# Patient Record
Sex: Male | Born: 1995 | Race: Black or African American | Hispanic: No | Marital: Single | Smoking: Never smoker
Health system: Southern US, Community
[De-identification: ages and names within clinical notes are randomized; demographics above are authoritative.]

## PROBLEM LIST (undated history)

## (undated) DIAGNOSIS — F840 Autistic disorder: Secondary | ICD-10-CM

---
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
## 2017-09-17 ENCOUNTER — Emergency Department (HOSPITAL_BASED_OUTPATIENT_CLINIC_OR_DEPARTMENT_OTHER)
Admission: EM | Admit: 2017-09-17 | Discharge: 2017-09-17 | Disposition: A | Discharge status: Home / Self Care | Payer: BLUE CROSS/BLUE SHIELD | Attending: Emergency Medicine | Admitting: Emergency Medicine

## 2017-09-17 ENCOUNTER — Encounter (HOSPITAL_BASED_OUTPATIENT_CLINIC_OR_DEPARTMENT_OTHER): Payer: Self-pay | Admitting: Emergency Medicine

## 2017-09-17 ENCOUNTER — Other Ambulatory Visit: Payer: Self-pay

## 2017-09-17 ENCOUNTER — Emergency Department (HOSPITAL_BASED_OUTPATIENT_CLINIC_OR_DEPARTMENT_OTHER): Payer: BLUE CROSS/BLUE SHIELD

## 2017-09-17 DIAGNOSIS — F840 Autistic disorder: Secondary | ICD-10-CM | POA: Insufficient documentation

## 2017-09-17 DIAGNOSIS — W010XXA Fall on same level from slipping, tripping and stumbling without subsequent striking against object, initial encounter: Secondary | ICD-10-CM | POA: Insufficient documentation

## 2017-09-17 DIAGNOSIS — Y92838 Other recreation area as the place of occurrence of the external cause: Secondary | ICD-10-CM | POA: Insufficient documentation

## 2017-09-17 DIAGNOSIS — Y998 Other external cause status: Secondary | ICD-10-CM | POA: Insufficient documentation

## 2017-09-17 DIAGNOSIS — S92251A Displaced fracture of navicular [scaphoid] of right foot, initial encounter for closed fracture: Secondary | ICD-10-CM | POA: Diagnosis not present

## 2017-09-17 DIAGNOSIS — Y9344 Activity, trampolining: Secondary | ICD-10-CM | POA: Diagnosis not present

## 2017-09-17 DIAGNOSIS — S99921A Unspecified injury of right foot, initial encounter: Secondary | ICD-10-CM | POA: Diagnosis present

## 2017-09-17 HISTORY — DX: Autistic disorder: F84.0

## 2017-09-17 MED ORDER — OXYCODONE-ACETAMINOPHEN 5-325 MG PO TABS: 1.0000 | ORAL_TABLET | Freq: Four times a day (QID) | ORAL | 0 refills | Status: AC | PRN

## 2017-09-17 NOTE — ED Triage Notes (Signed)
R foot and ankle pain after twisting it at the trampoline park.

## 2017-09-17 NOTE — Discharge Instructions (Addendum)
Please read and follow all provided instructions.  You have been seen today for an injury to your right foot/ankle.  Tests performed today include: An x-ray of the affected area showed that you have a fracture in the foot, this is a fracture of the navicular bone.  Placing your right lower extremity in a splint, please remain in the splint and remain nonweightbearing utilizing crutches until you have followed up with the orthopedic doctor and your discharge instructions. Vital signs. See below for your results today.   Home care instructions: -- *PRICE in the first 24-48 hours after injury: Protect (with brace, splint, sling), if given by your provider Rest Ice-apply ice pack or ice wrapped in a towel/plastic bag over the foot in the splint.  Apply ice for 20 min, then remove for 40 min while awake Compression- Wear brace, elastic bandage, splint as directed by your provider-please be sure to wear this and do not remove until you have seen the orthopedic doctor. Elevate affected extremity above the level of your heart when not walking around for the first 24-48 hours   Medications:  Take ibuprofen 600 mg every 6 hours as needed for pain.  You were given a prescription for Percocet which you may take for severe pain.  Percocet-this is a narcotic/controlled substance medication that has potential addicting qualities.  We recommend that you take 1-2 tablets every 6 hours as needed for severe pain.  Do not drive or operate heavy machinery when taking this medicine as it can be sedating. Do not drink alcohol or take other sedating medications when taking this medicine for safety reasons.  Keep this out of reach of small children.  Please be aware this medicine has Tylenol in it (325 mg/tab) do not exceed the maximum dose of Tylenol in a day per over the counter recommendations should you decide to supplement with Tylenol over the counter.   We have prescribed you new medication(s) today. Discuss the  medications prescribed today with your pharmacist as they can have adverse effects and interactions with your other medicines including over the counter and prescribed medications. Seek medical evaluation if you start to experience new or abnormal symptoms after taking one of these medicines, seek care immediately if you start to experience difficulty breathing, feeling of your throat closing, facial swelling, or rash as these could be indications of a more serious allergic reaction  Follow-up instructions: Please follow-up with the orthopedic doctor and your discharge instructions within 5 days.  Return instructions:  Please return if your digits or extremity are numb or tingling, appear gray or blue, or you have severe pain (also elevate the extremity and loosen splint or wrap if you were given one) Please return if you have redness or fevers.  Please return to the Emergency Department if you experience worsening symptoms.  Please return if you have any other emergent concerns. Additional Information:  Your vital signs today were: BP 125/74 (BP Location: Left Arm)    Pulse 79    Temp 98.4 F (36.9 C) (Oral)    Resp 16    SpO2 100%  If your blood pressure (BP) was elevated above 135/85 this visit, please have this repeated by your doctor within one month. ---------------

## 2017-09-17 NOTE — ED Provider Notes (Signed)
[facility] EMERGENCY DEPARTMENT Provider Note   CSN: [id number] Arrival date & time: 09/17/17  1446   History   Chief Complaint Chief Complaint  Patient presents with  . Foot Injury    HPI Phillip Carroll is a 22 y.o. male with a hx of autism who presents to the ED with his mother with complaints of R foot/ankle injury which occurred yesterday. Patient states that he was at a trampoline park and when he came down after jumping he landed with an inversion injury to the R ankle/foot. He did not fall to the ground or hit his head. He has been having fairly persistent pain/swelling mostly to the mid/forefoot. Pain is a 4/10 in severity, worse with attempting to bear weight, has been favoring the LLE.  Denies numbness tingling, weakness, or any other areas of injury.  HPI  Past Medical History:  Diagnosis Date  . Autism     Patient Active Problem List   Diagnosis Date Noted  . Essential and other specified forms of tremor 04/18/2012  . Headache(784.0) 04/18/2012  . Other nonspecific abnormal result of function study of brain and central nervous system 04/18/2012  . Nystagmus, unspecified 04/18/2012    History reviewed. No pertinent surgical history.      Home Medications    Prior to Admission medications   Not on File    Family History Family History  Problem Relation Age of Onset  . Migraines Mother   . Seizures Cousin   . Down syndrome Cousin     Social History Social History   Tobacco Use  . Smoking status: Never Smoker  . Smokeless tobacco: Never Used  Substance Use Topics  . Alcohol use: No  . Drug use: No     Allergies   Patient has no known allergies.   Review of Systems Review of Systems  Constitutional: Negative for chills and fever.  Musculoskeletal:       Positive for R foot pain/swelling.   Neurological: Negative for weakness and numbness.     Physical Exam Updated Vital Signs BP 125/74 (BP Location: Left Arm)   Pulse 79    Temp 98.4 F (36.9 C) (Oral)   Resp 16   SpO2 100%   Physical Exam  Constitutional: He appears well-developed and well-nourished. No distress.  HENT:  Head: Normocephalic and atraumatic.  Eyes: Conjunctivae are normal. Right eye exhibits no discharge. Left eye exhibits no discharge.  Cardiovascular:  Pulses:      Dorsalis pedis pulses are 2+ on the right side, and 2+ on the left side.       Posterior tibial pulses are 2+ on the right side, and 2+ on the left side.  Musculoskeletal:  Lower Extremities: Patient has mild to moderate amount of soft tissue swelling to the right mid and forefoot.  There is no overlying erythema, warmth, ecchymosis, or open wounds.  He has normal range of motion to bilateral knees, ankles, is able to move all digits.  Patient is tender to palpation over the R midfoot and forefoot dorsally, he is tender over the navicular bone and somewhat over the base of the fifth.  There is no medial or lateral malleoli or tenderness.  No tenderness over the fibular head.  Lower extremities are otherwise nontender.  Neurovascularly intact distally.  Neurological: He is alert.  Clear speech.  Sensation grossly intact bilateral lower extremities.  5 out of 5 strength with plantar dorsiflexion bilaterally.  Gait is intact but antalgic.  Psychiatric: He  has a normal mood and affect. His behavior is normal. Thought content normal.  Nursing note and vitals reviewed.    ED Treatments / Results  Labs (all labs ordered are listed, but only abnormal results are displayed) Labs Reviewed - No data to display  EKG None  Radiology Dg Ankle Complete Right  Result Date: 09/17/2017 CLINICAL DATA:  Twisted right ankle yesterday trampoline park. EXAM: RIGHT ANKLE - COMPLETE 3+ VIEW COMPARISON:  None. FINDINGS: Small fracture fragment/s at the dorsal navicular. Normal ankle and hindfoot alignment. IMPRESSION: Small fracture fragment dorsal to the navicular. Electronically Signed   By:  Marnee Spring M.D.   On: 09/17/2017 15:21   Dg Foot Complete Right  Result Date: 09/17/2017 CLINICAL DATA:  Twisted right ankle yesterday with mid foot pain. Initial encounter. EXAM: RIGHT FOOT COMPLETE - 3+ VIEW COMPARISON:  None. FINDINGS: Small crescentic fracture fragment/s at the dorsal navicular. Normal alignment of the foot. IMPRESSION: Small dorsal navicular fracture. Electronically Signed   By: Marnee Spring M.D.   On: 09/17/2017 15:20    Procedures Procedures (including critical care time) SPLINT APPLICATION Date/Time: 5:36 PM Authorized by: Harvie Heck Consent: Verbal consent obtained. Risks and benefits: risks, benefits and alternatives were discussed Consent given by: patient Splint applied by: EDT Location details: RLE Splint type: short leg Post-procedure: The splinted body part was neurovascularly unchanged following the procedure. Patient tolerance: Patient tolerated the procedure well with no immediate complications.   Medications Ordered in ED Medications - No data to display   Initial Impression / Assessment and Plan / ED Course  I have reviewed the triage vital signs and the nursing notes.  Pertinent labs & imaging results that were available during my care of the patient were reviewed by me and considered in my medical decision making (see chart for details).   Patient presents to the emergency department status post right foot/ankle injury.  Patient nontoxic-appearing, resting comfortably, vitals WNL.  X-ray obtained per triage protocol reveals  small dorsal navicular fracture.  Neurovascularly intact distally.  No overlying open wounds.  Will place patient in short leg splint, nonweightbearing with crutches, with orthopedics follow-up.  Recommended NSAIDs and PRICE with short-term prescription for Percocet for severe pain, [place] Controlled Substance reporting System queried. I discussed results, treatment plan, need for  follow-up, and  return precautions with the patient and his mother at bedside. Provided opportunity for questions, patient and his mother confirmed understanding and are in agreement with plan.   Findings and plan of care discussed with supervising physician Dr. Criss Alvine- in agreement.   Final Clinical Impressions(s) / ED Diagnoses   Final diagnoses:  Closed displaced fracture of navicular bone of right foot, initial encounter    ED Discharge Orders         Ordered    oxyCODONE-acetaminophen (PERCOCET/ROXICET) 5-325 MG tablet  Every 6 hours PRN     09/17/17 1742           Mauro Arps, Pleas Koch, PA-C 09/17/17 1837    Pricilla Loveless, MD 09/17/17 1909

## 2019-07-01 IMAGING — DX DG ANKLE COMPLETE 3+V*R*
3 series · 3 of 3 positions shown · non-contrast
Comparison: None.

CLINICAL DATA: Twisted right ankle yesterday trampoline park.

EXAM:
RIGHT ANKLE - COMPLETE 3+ VIEW

[ankle ap]
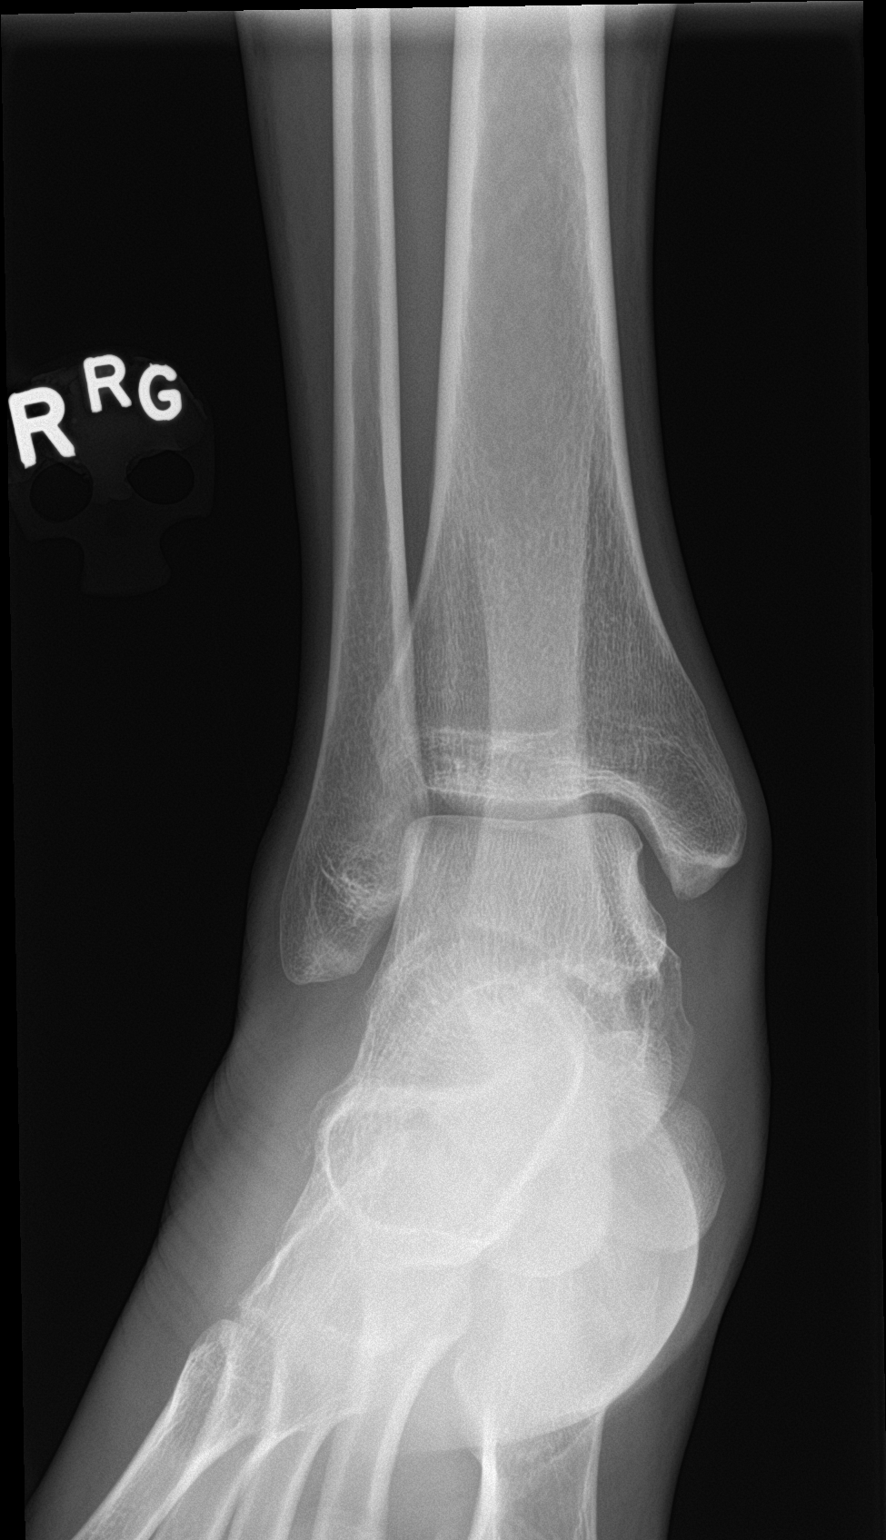

[ankle obl]
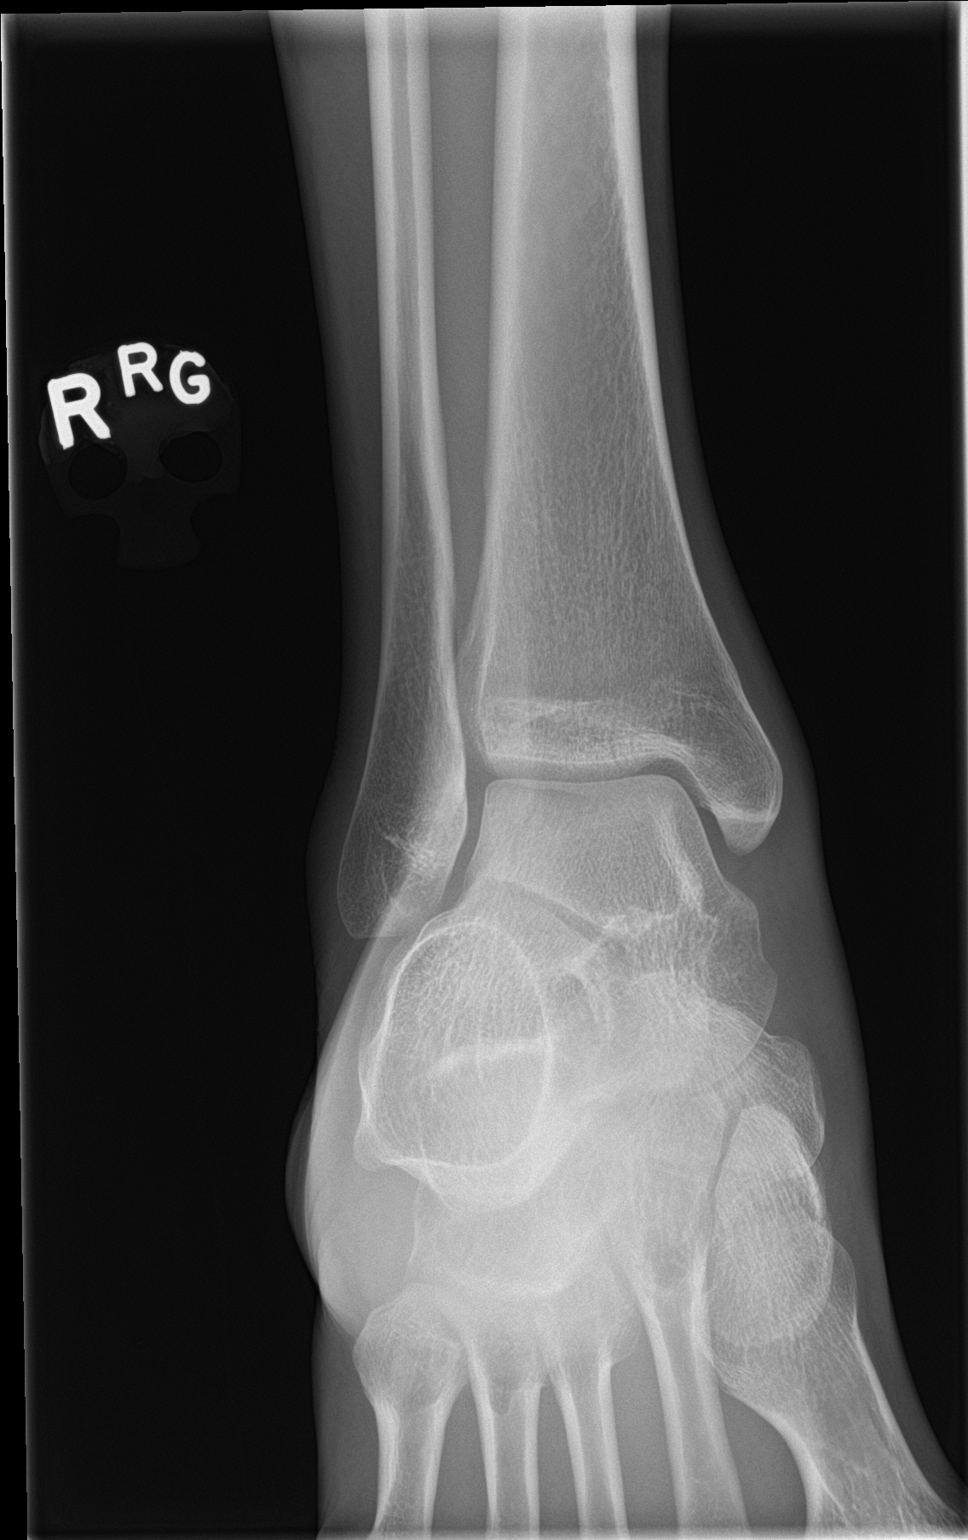

[ankle lat]
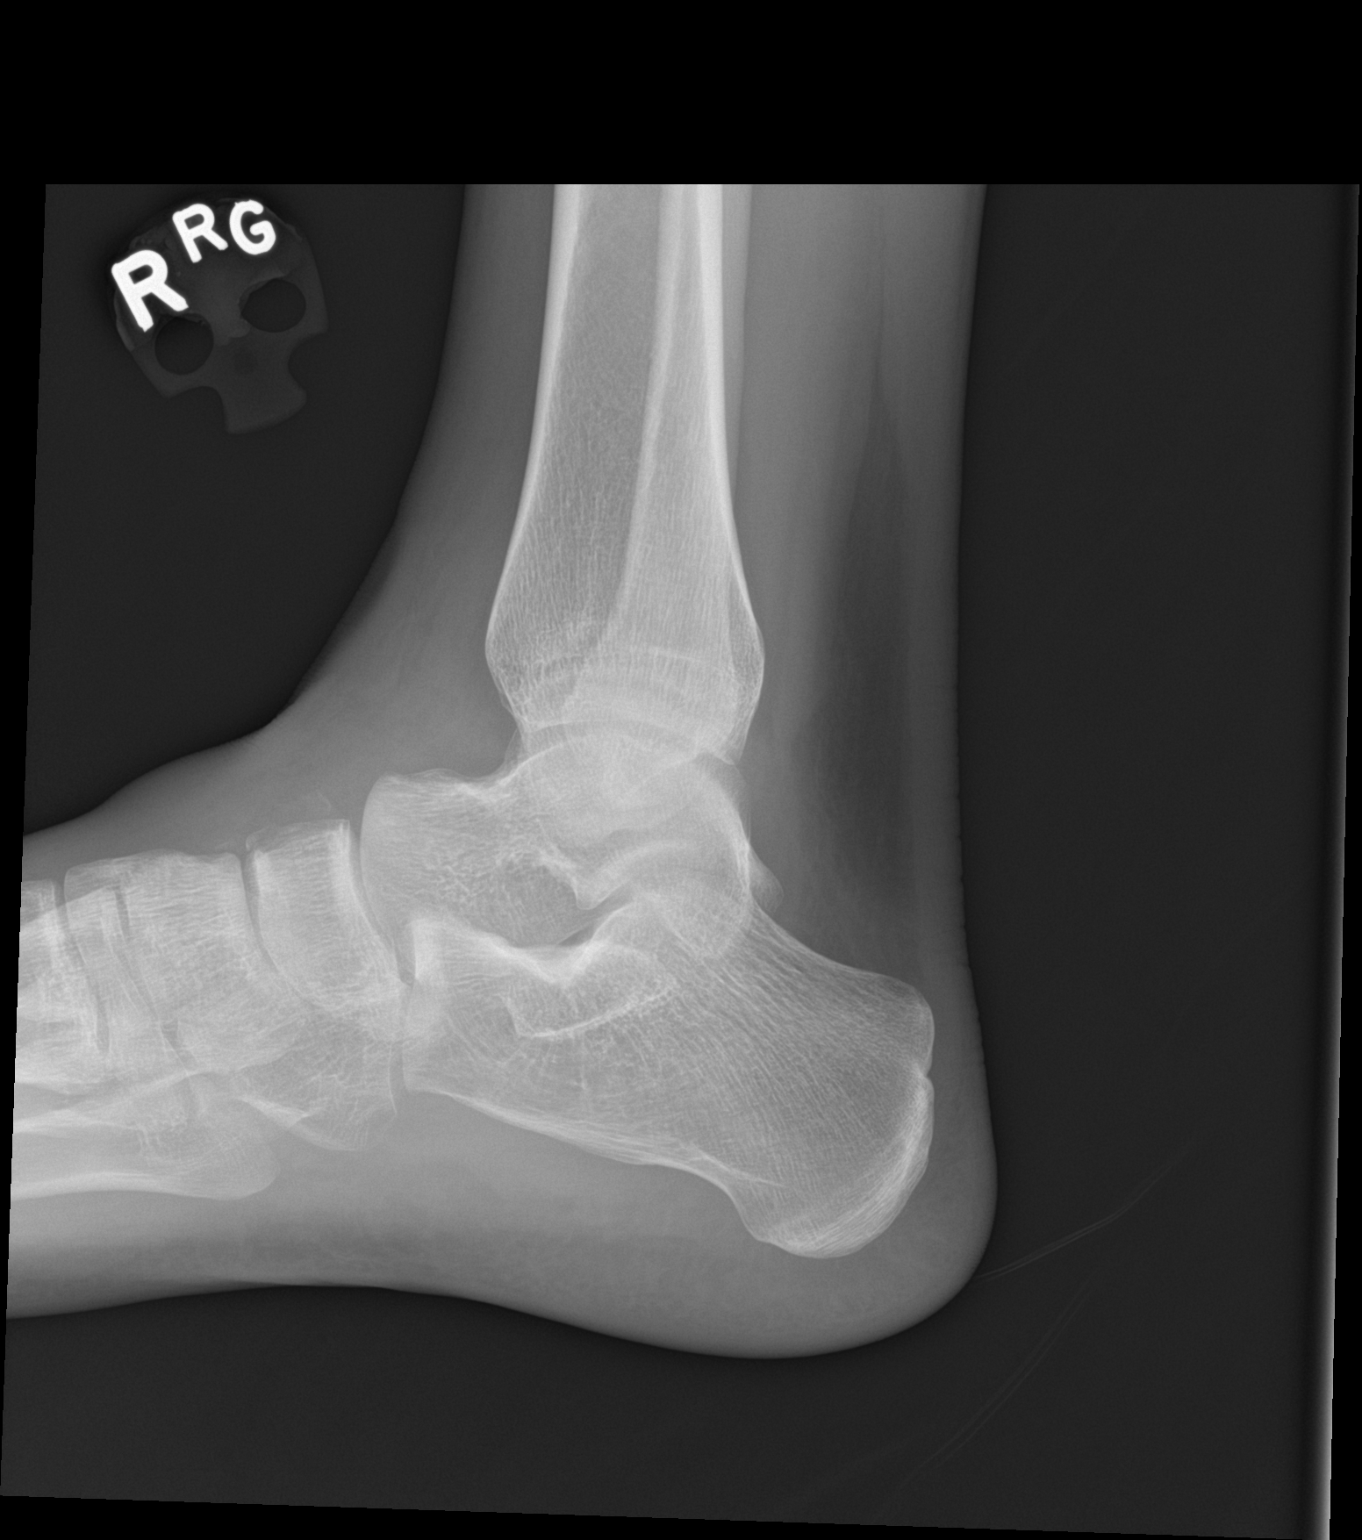

[3 of 3 positions shown; findings below may reference images not displayed]

FINDINGS: Small fracture fragment/s at the dorsal navicular. Normal ankle and
hindfoot alignment.
IMPRESSION: Small fracture fragment dorsal to the navicular.
